# Patient Record
Sex: Female | Born: 1995 | Race: Black or African American | Hispanic: No | Marital: Single | Smoking: Never smoker
Health system: Southern US, Community
[De-identification: ages and names within clinical notes are randomized; demographics above are authoritative.]

## PROBLEM LIST (undated history)

## (undated) HISTORY — PX: CYST EXCISION: SHX5701

---

## 2016-01-26 ENCOUNTER — Encounter (HOSPITAL_COMMUNITY): Payer: Self-pay | Admitting: Emergency Medicine

## 2016-01-26 ENCOUNTER — Emergency Department (HOSPITAL_COMMUNITY)
Admission: EM | Admit: 2016-01-26 | Discharge: 2016-01-26 | Disposition: A | Discharge status: Home / Self Care | Payer: BLUE CROSS/BLUE SHIELD | Attending: Emergency Medicine | Admitting: Emergency Medicine

## 2016-01-26 ENCOUNTER — Emergency Department (HOSPITAL_COMMUNITY): Payer: BLUE CROSS/BLUE SHIELD

## 2016-01-26 DIAGNOSIS — R1110 Vomiting, unspecified: Secondary | ICD-10-CM | POA: Insufficient documentation

## 2016-01-26 DIAGNOSIS — B9789 Other viral agents as the cause of diseases classified elsewhere: Secondary | ICD-10-CM

## 2016-01-26 DIAGNOSIS — R05 Cough: Secondary | ICD-10-CM | POA: Diagnosis present

## 2016-01-26 DIAGNOSIS — J069 Acute upper respiratory infection, unspecified: Secondary | ICD-10-CM | POA: Insufficient documentation

## 2016-01-26 NOTE — ED Provider Notes (Signed)
CSN: [id number]     Arrival date & time 01/26/16  1191 History   First MD Initiated Contact with Patient 01/26/16 0914     Chief Complaint  Patient presents with  . Influenza  . Cough     (Consider location/radiation/quality/duration/timing/severity/associated sxs/prior Treatment) HPI 20 year old female who is on Remicade for the hidradenitis presents with a cough for the past 5 days. 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 She has also had a sick contact who has been diagnosed with the flu. Patient states that 3 days ago she started developing congestion, worse cough with yellow sputum, transient sore throat, and some shortness of breath. Denies headaches or myalgias. Has not had any fevers. Has been taking DayQuil. Concerned about having the flu or something worse.  History reviewed. No pertinent past medical history. Past Surgical History  Procedure Laterality Date  . Cyst excision     No family history on file. Social History  Substance Use Topics  . Smoking status: Never Smoker   . Smokeless tobacco: None  . Alcohol Use: No   OB History    No data available     Review of Systems  Constitutional: Negative for fever.  HENT: Positive for congestion, rhinorrhea and sore throat.   Respiratory: Positive for cough and shortness of breath.   Gastrointestinal: Positive for vomiting (once episode of post-tussive emesis). Negative for nausea and abdominal pain.  Neurological: Negative for headaches.  All other systems reviewed and are negative.     Allergies  Review of patient's allergies indicates no known allergies.  Home Medications   Prior to Admission medications   Not on File   BP 129/71 mmHg  Pulse 76  Temp(Src) 98.6 F (37 C) (Oral)  Resp 14  SpO2 100%  LMP 01/12/2016 Physical Exam  Constitutional: She is oriented to person, place, and time. She appears  well-developed and well-nourished.  HENT:  Head: Normocephalic and atraumatic.  Right Ear: External ear normal.  Left Ear: External ear normal.  Nose: Nose normal.  Mouth/Throat: Oropharynx is clear and moist. No oropharyngeal exudate.  Eyes: Right eye exhibits no discharge. Left eye exhibits no discharge.  Cardiovascular: Normal rate, regular rhythm and normal heart sounds.   Pulmonary/Chest: Effort normal and breath sounds normal.  Abdominal: Soft. There is no tenderness.  Neurological: She is alert and oriented to person, place, and time.  Skin: Skin is warm and dry.  Nursing note and vitals reviewed.   ED Course  Procedures (including critical care time) Labs Review Labs Reviewed - No data to display  Imaging Review Dg Chest 2 View  01/26/2016  CLINICAL DATA:  Flu like symptoms.  Chest congestion and cough. EXAM: CHEST  2 VIEW COMPARISON:  None. FINDINGS: The heart, hila, mediastinum, lungs, and pleura are normal. No acute abnormalities identified. IMPRESSION: No active cardiopulmonary disease. Electronically Signed   By: Gerome Sam III M.D   On: 01/26/2016 10:08   I have personally reviewed and evaluated these images and lab results as part of my medical decision-making.   EKG Interpretation None      MDM   Final diagnoses:  Viral upper respiratory tract infection with cough    Patient symptoms are consistent with a viral upper restaurant infection. While she could have influenza think this is less likely given her mild course without fever or significant myalgias. No indication for specific treatment. She declines  cough medicine or other treatment. Appears quite well, no signs of more severe disease or sepsis. Discharge home with outpatient symptomatically and follow-up with PCP.    Pricilla Loveless, MD 01/26/16 1019

## 2016-01-26 NOTE — ED Notes (Signed)
Pt reports that her roommate mom was recently diagnosed with flu, and that her roommate has been sick. Pt reports cough onset Sunday night. 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

## 2016-07-06 IMAGING — DX DG CHEST 2V
2 series · 2 of 2 positions shown · non-contrast
Comparison: None.

CLINICAL DATA: Flu like symptoms.  Chest congestion and cough.

EXAM:
CHEST  2 VIEW

[chest pa]
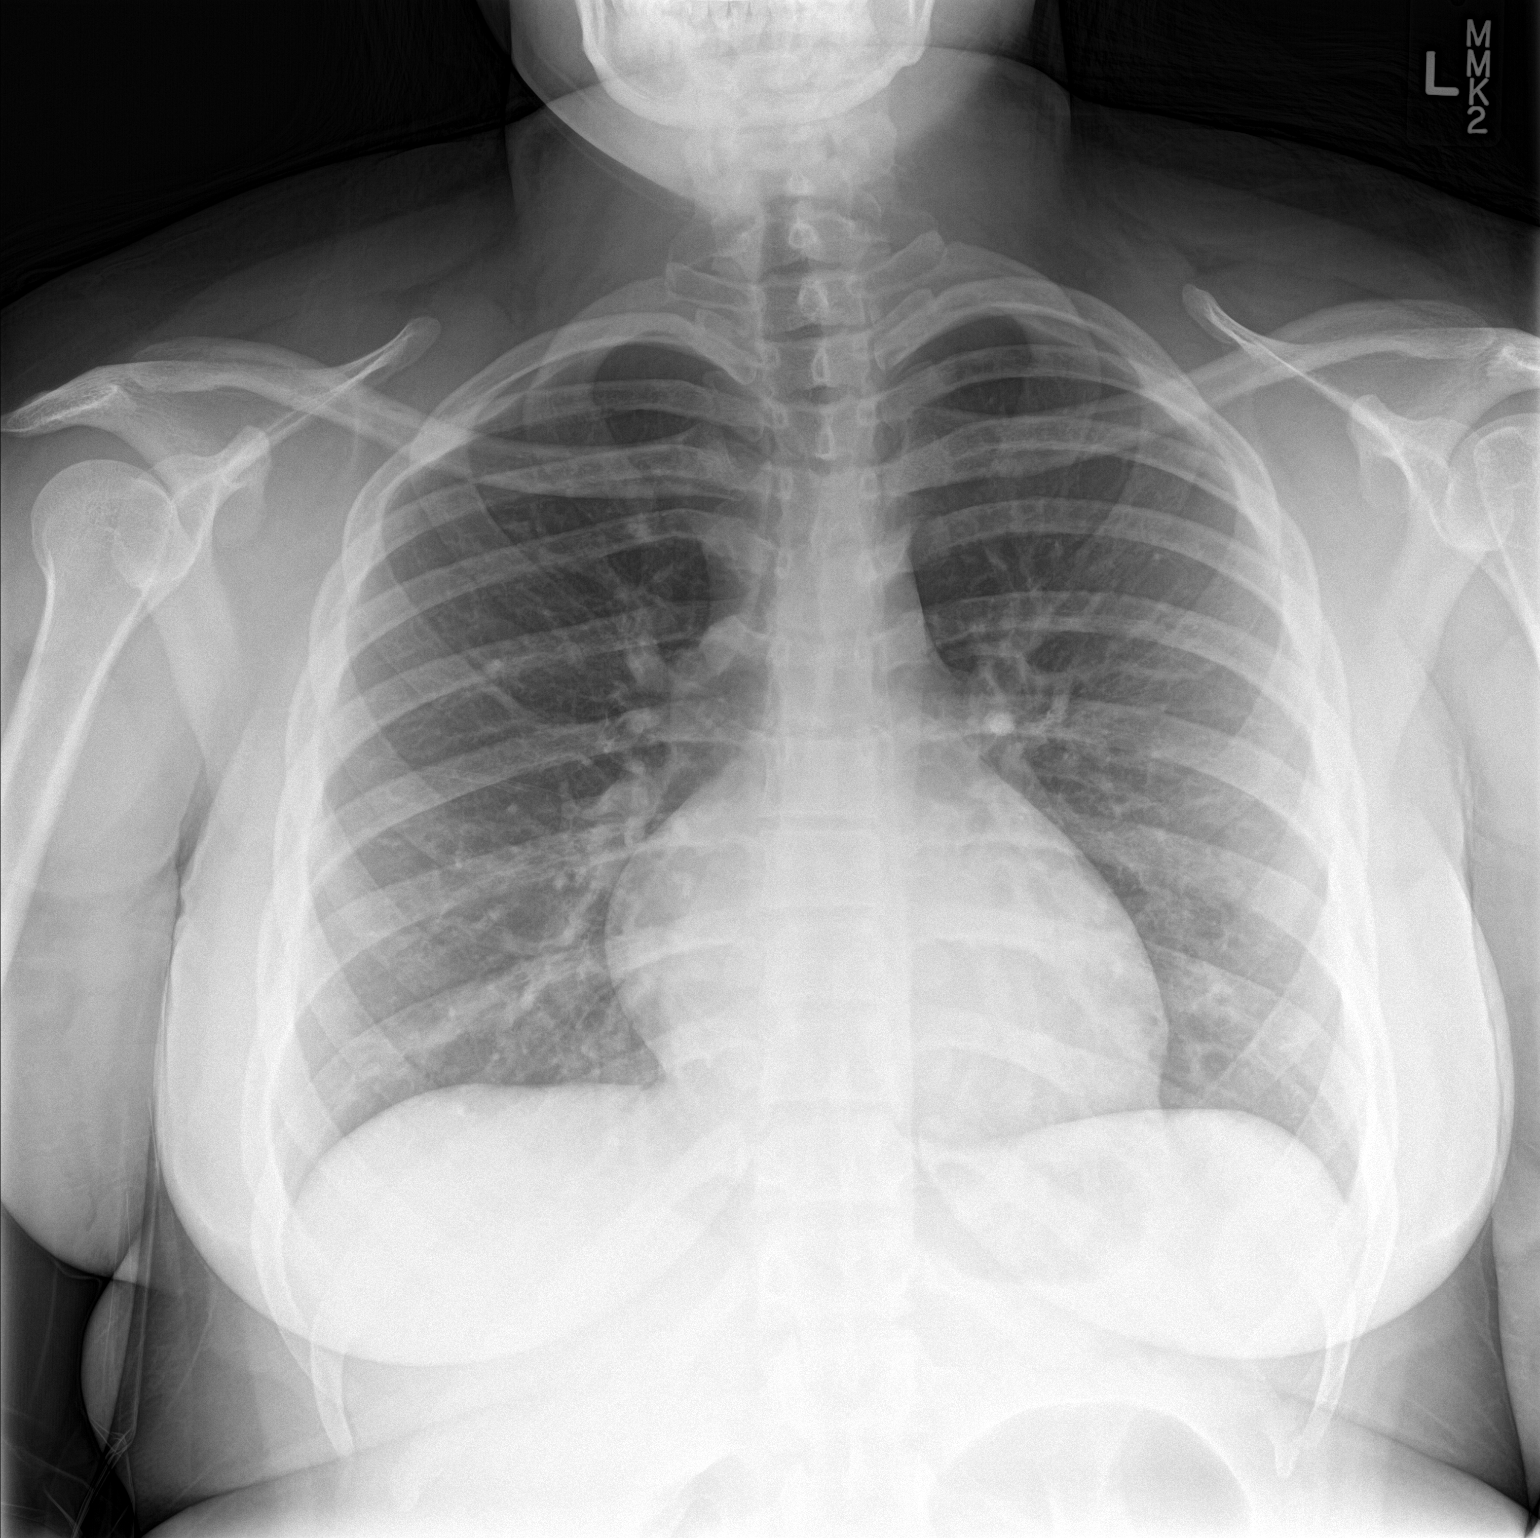

[chest lat]
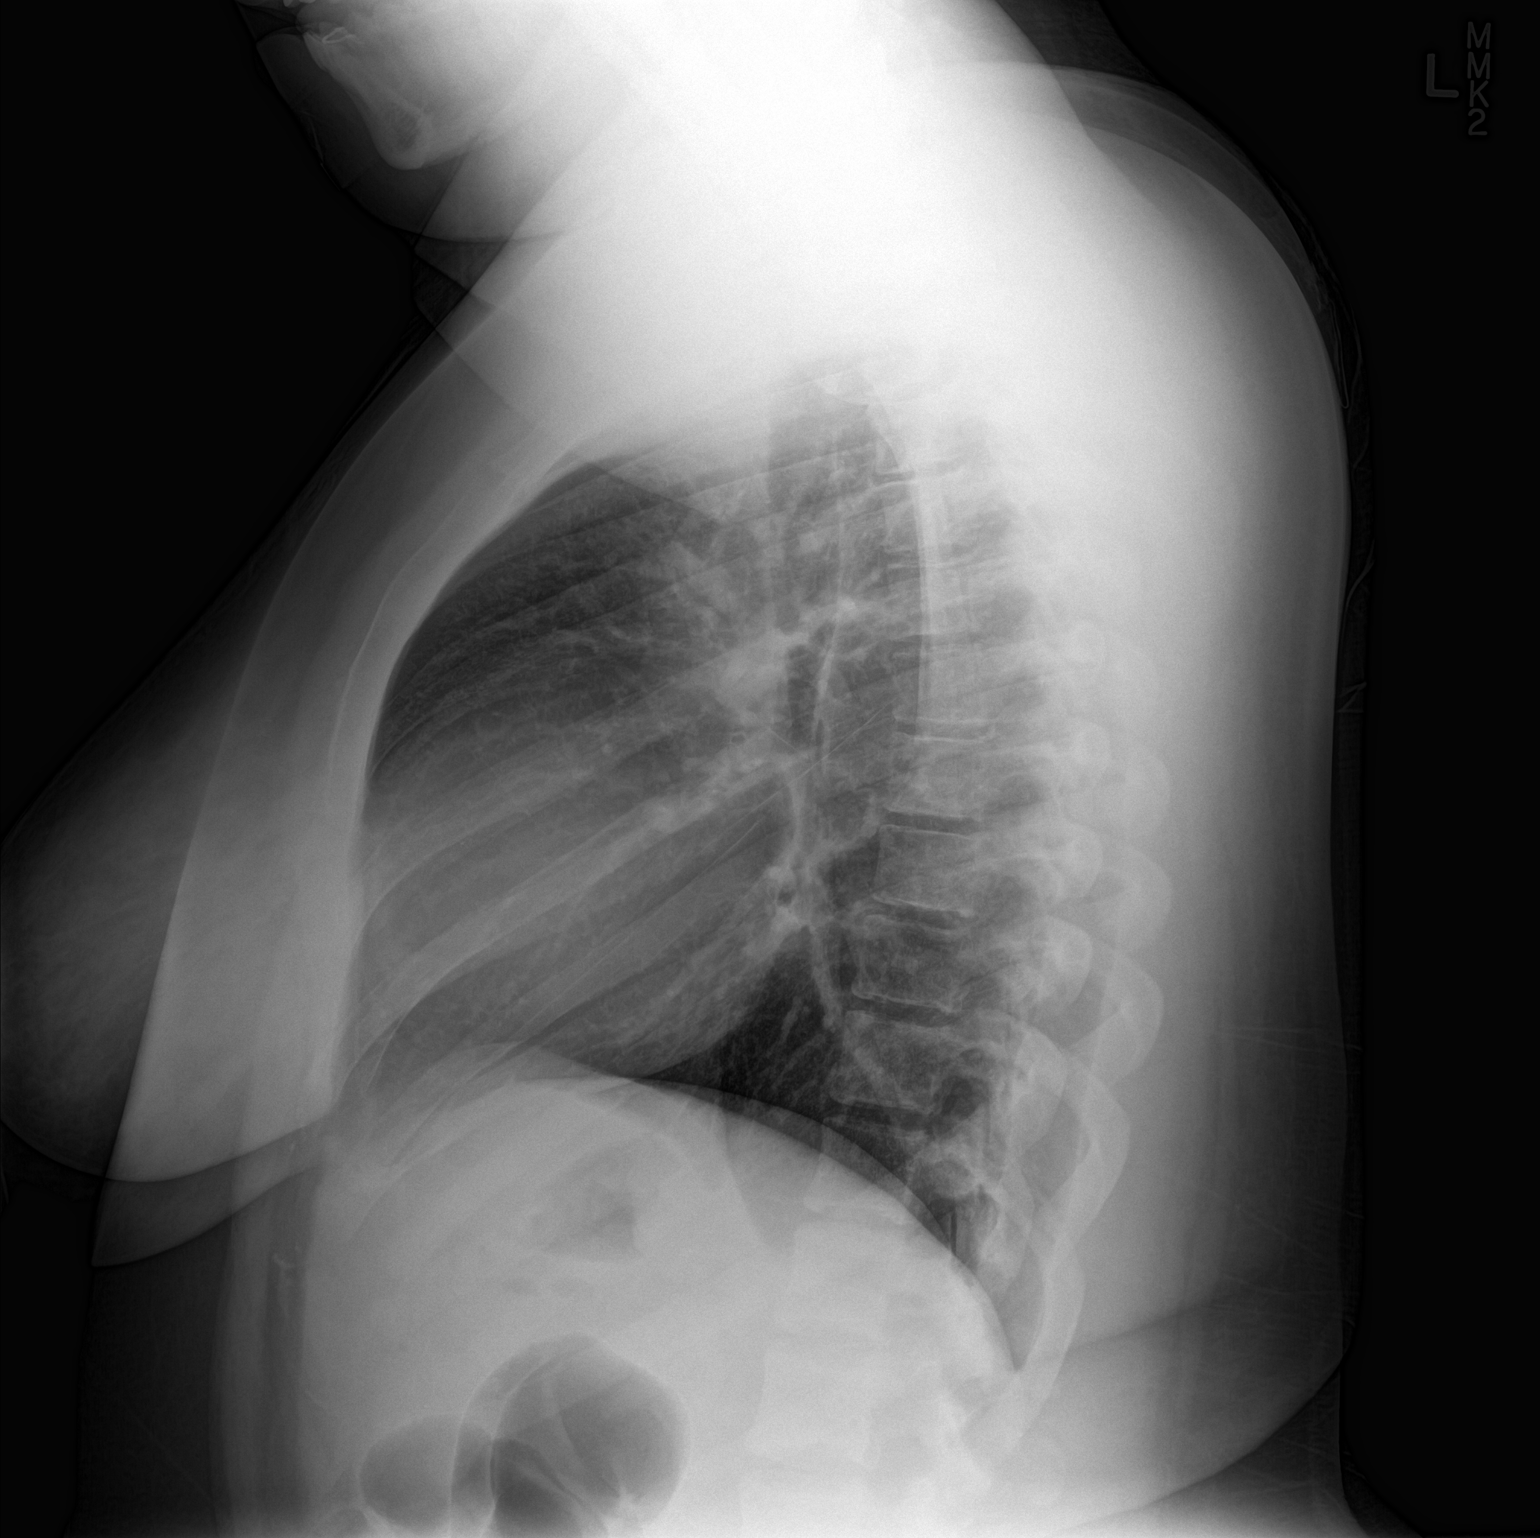

[2 of 2 positions shown; findings below may reference images not displayed]

FINDINGS: The heart, hila, mediastinum, lungs, and pleura are normal. No acute
abnormalities identified.
IMPRESSION: No active cardiopulmonary disease.

## 2023-12-25 ENCOUNTER — Encounter (HOSPITAL_COMMUNITY): Payer: Self-pay | Admitting: Emergency Medicine

## 2023-12-25 ENCOUNTER — Ambulatory Visit (INDEPENDENT_AMBULATORY_CARE_PROVIDER_SITE_OTHER): Payer: Self-pay

## 2023-12-25 ENCOUNTER — Ambulatory Visit (HOSPITAL_COMMUNITY)
Admission: EM | Admit: 2023-12-25 | Discharge: 2023-12-25 | Disposition: A | Discharge status: Home / Self Care | Payer: Self-pay | Attending: Emergency Medicine | Admitting: Emergency Medicine

## 2023-12-25 DIAGNOSIS — S6701XA Crushing injury of right thumb, initial encounter: Secondary | ICD-10-CM

## 2023-12-25 MED ORDER — IBUPROFEN 800 MG PO TABS
800.0000 mg | ORAL_TABLET | Freq: Three times a day (TID) | ORAL | 0 refills | Status: AC | PRN
Start: 2023-12-25 — End: 2024-01-08

## 2023-12-25 NOTE — ED Triage Notes (Signed)
Pt smashed her right thumb in door at work earlier this week. Been icing.

## 2023-12-25 NOTE — ED Provider Notes (Signed)
[facility]    CSN: [id number] Arrival date & time: 12/25/23  1008    HISTORY   Chief Complaint  Patient presents with   Finger Injury   HPI Gloria Hernandez is a pleasant, 28 y.o. female who presents to urgent care today. The history is provided by the patient.   History reviewed. No pertinent past medical history. There are no active problems to display for this patient.  Past Surgical History:  Procedure Laterality Date   CYST EXCISION     OB History   No obstetric history on file.    Home Medications    Prior to Admission medications   Medication Sig Start Date End Date Taking? Authorizing Provider  Homeopathic Products [facility] COLD REMEDY PO) Take 1 Dose by mouth every 8 (eight) hours as needed (cold symptoms).     [provider]  inFLIXimab (REMICADE) 100 MG injection Inject 100 mg into the vein every 8 (eight) weeks.    [provider]  Multiple Vitamins-Minerals (AIRBORNE PO) Take 1 Dose by mouth every 8 (eight) hours as needed (cold symptoms).     [provider]  Pseudoephedrine-APAP-DM (DAYQUIL PO) Take 1 Dose by mouth every 8 (eight) hours as needed (cold symptoms).     [provider]    Family History No family history on file. Social History Social History   Tobacco Use   Smoking status: Never  Substance Use Topics   Alcohol use: No   Drug use: No   Allergies   Patient has no known allergies.  Review of Systems Review of Systems Pertinent findings revealed after performing a 14 point review of systems has been noted in the history of present illness.  Physical Exam Vital Signs BP 132/80 (BP Location: Right Arm)   Pulse 82   Temp 99.4 F (37.4 C) (Oral)   Resp 17   LMP 12/11/2023 (Approximate)   SpO2 98%   No data found.  Physical Exam Vitals and nursing note reviewed.  Constitutional:      General: She is not in acute distress.    Appearance: Normal appearance.  HENT:     Head:  Normocephalic and atraumatic.  Eyes:     Pupils: Pupils are equal, round, and reactive to light.  Cardiovascular:     Rate and Rhythm: Normal rate and regular rhythm.  Pulmonary:     Effort: Pulmonary effort is normal.     Breath sounds: Normal breath sounds.  Musculoskeletal:        General: Normal range of motion.       Hands:     Cervical back: Normal range of motion and neck supple.  Skin:    General: Skin is warm and dry.  Neurological:     General: No focal deficit present.     Mental Status: She is alert and oriented to person, place, and time. Mental status is at baseline.  Psychiatric:        Mood and Affect: Mood normal.        Behavior: Behavior normal.        Thought Content: Thought content normal.        Judgment: Judgment normal.     Visual Acuity Right Eye Distance:   Left Eye Distance:   Bilateral Distance:    Right Eye Near:   Left Eye Near:    Bilateral Near:     UC Couse / Diagnostics / Procedures:     Radiology No results found.  Procedures  Procedures (including critical care time) EKG  Pending results:  Labs Reviewed - No data to display  Medications Ordered in UC: Medications - No data to display  UC Diagnoses / Final Clinical Impressions(s)   I have reviewed the triage vital signs and the nursing notes.  Pertinent labs & imaging results that were available during my care of the patient were reviewed by me and considered in my medical decision making (see chart for details).    Final diagnoses:  Crushing injury of right thumb, initial encounter   Per dependent read of patient's x-ray, patient may have crush the distal phalanx of her right thumb.  We will contact patient with official radiology report once we receive it.  In the meantime, patient was placed in a splint for protection, provided with prescription for ibuprofen 800 mg that she can take every 6-8 hours as needed for pain and patient advised to keep hand elevated and ice  is much as she likes.  Conservative care recommended.  Return precautions advised.  Please see discharge instructions below for details of plan of care as provided to patient. ED Prescriptions     Medication Sig Dispense Auth. Provider   ibuprofen (ADVIL) 800 MG tablet Take 1 tablet (800 mg total) by mouth every 8 (eight) hours as needed for up to 14 days. 42 tablet Theadora Rama Scales, PA-C      PDMP not reviewed this encounter.  Pending results:  Labs Reviewed - No data to display    Discharge Instructions      We will contact you with the official report of your x-ray of your right thumb.  Per my personal interpretation, it is possible that bone in the tip of your right thumb has been crushed.  No treatment is needed other than to protect the thumb until it is feeling better.  We placed you in a splint today you are welcome to wear as tolerated to keep you from bumping her thumb and anything else.    To reduce pain, try to keep your hand elevated.  I have also provided you with a prescription for ibuprofen 800 mg that you can take every 6-8 hours as needed.  You can also continue icing her thumb is much as you like.  If your thumb is feeling no better in the next 10 to 14 days, please follow-up with an orthopedic specialist for further evaluation.  Thank you for visiting [facility] today.        Disposition Upon Discharge:  Condition: stable for discharge home  Patient presented with an acute illness with associated systemic symptoms and significant discomfort requiring urgent management. In my opinion, this is a condition that a prudent lay person (someone who possesses an average knowledge of health and medicine) may potentially expect to result in complications if not addressed urgently such as respiratory distress, impairment of bodily function or dysfunction of bodily organs.   Routine symptom specific, illness specific and/or disease specific  instructions were discussed with the patient and/or caregiver at length.   As such, the patient has been evaluated and assessed, work-up was performed and treatment was provided in alignment with urgent care protocols and evidence based medicine.  Patient/parent/caregiver has been advised that the patient may require follow up for further testing and treatment if the symptoms continue in spite of treatment, as clinically indicated and appropriate.  Patient/parent/caregiver has been advised to return to the [facility] or PCP if no better; to PCP or the  Emergency Department if new signs and symptoms develop, or if the current signs or symptoms continue to change or worsen for further workup, evaluation and treatment as clinically indicated and appropriate  The patient will follow up with their current PCP if and as advised. If the patient does not currently have a PCP we will assist them in obtaining one.   The patient may need specialty follow up if the symptoms continue, in spite of conservative treatment and management, for further workup, evaluation, consultation and treatment as clinically indicated and appropriate.  Patient/parent/caregiver verbalized understanding and agreement of plan as discussed.  All questions were addressed during visit.  Please see discharge instructions below for further details of plan.  This office note has been dictated using Teaching laboratory technician.  Unfortunately, this method of dictation can sometimes lead to typographical or grammatical errors.  I apologize for your inconvenience in advance if this occurs.  Please do not hesitate to reach out to me if clarification is needed.      Theadora Rama Scales, PA-C 12/25/23 1140

## 2023-12-25 NOTE — Discharge Instructions (Signed)
We will contact you with the official report of your x-ray of your right thumb.  Per my personal interpretation, it is possible that bone in the tip of your right thumb has been crushed.  No treatment is needed other than to protect the thumb until it is feeling better.  We placed you in a splint today you are welcome to wear as tolerated to keep you from bumping her thumb and anything else.    To reduce pain, try to keep your hand elevated.  I have also provided you with a prescription for ibuprofen 800 mg that you can take every 6-8 hours as needed.  You can also continue icing her thumb is much as you like.  If your thumb is feeling no better in the next 10 to 14 days, please follow-up with an orthopedic specialist for further evaluation.  Thank you for visiting [facility] today.

## 2024-02-04 DIAGNOSIS — M2540 Effusion, unspecified joint: Secondary | ICD-10-CM | POA: Diagnosis not present

## 2024-02-04 DIAGNOSIS — Z87891 Personal history of nicotine dependence: Secondary | ICD-10-CM | POA: Diagnosis not present

## 2024-02-04 DIAGNOSIS — L03116 Cellulitis of left lower limb: Secondary | ICD-10-CM | POA: Diagnosis not present

## 2024-02-08 DIAGNOSIS — Z87891 Personal history of nicotine dependence: Secondary | ICD-10-CM | POA: Diagnosis not present

## 2024-02-08 DIAGNOSIS — L0390 Cellulitis, unspecified: Secondary | ICD-10-CM | POA: Diagnosis not present

## 2024-02-08 DIAGNOSIS — L03116 Cellulitis of left lower limb: Secondary | ICD-10-CM | POA: Diagnosis not present

## 2024-03-14 DIAGNOSIS — Z79899 Other long term (current) drug therapy: Secondary | ICD-10-CM | POA: Diagnosis not present

## 2024-03-14 DIAGNOSIS — R030 Elevated blood-pressure reading, without diagnosis of hypertension: Secondary | ICD-10-CM | POA: Diagnosis not present

## 2024-03-14 DIAGNOSIS — L732 Hidradenitis suppurativa: Secondary | ICD-10-CM | POA: Diagnosis not present

## 2024-03-14 DIAGNOSIS — Z87891 Personal history of nicotine dependence: Secondary | ICD-10-CM | POA: Diagnosis not present

## 2024-03-14 DIAGNOSIS — L03116 Cellulitis of left lower limb: Secondary | ICD-10-CM | POA: Diagnosis not present

## 2024-03-14 DIAGNOSIS — Z872 Personal history of diseases of the skin and subcutaneous tissue: Secondary | ICD-10-CM | POA: Diagnosis not present

## 2024-03-14 DIAGNOSIS — Z6836 Body mass index (BMI) 36.0-36.9, adult: Secondary | ICD-10-CM | POA: Diagnosis not present

## 2024-03-14 DIAGNOSIS — Z91048 Other nonmedicinal substance allergy status: Secondary | ICD-10-CM | POA: Diagnosis not present

## 2024-03-14 DIAGNOSIS — D509 Iron deficiency anemia, unspecified: Secondary | ICD-10-CM | POA: Diagnosis not present

## 2024-03-14 DIAGNOSIS — F329 Major depressive disorder, single episode, unspecified: Secondary | ICD-10-CM | POA: Diagnosis not present

## 2024-03-16 DIAGNOSIS — D509 Iron deficiency anemia, unspecified: Secondary | ICD-10-CM | POA: Diagnosis not present

## 2024-03-16 DIAGNOSIS — B999 Unspecified infectious disease: Secondary | ICD-10-CM | POA: Diagnosis not present

## 2024-03-16 DIAGNOSIS — L03116 Cellulitis of left lower limb: Secondary | ICD-10-CM | POA: Diagnosis not present

## 2024-03-16 DIAGNOSIS — D638 Anemia in other chronic diseases classified elsewhere: Secondary | ICD-10-CM | POA: Diagnosis not present

## 2024-03-16 DIAGNOSIS — L732 Hidradenitis suppurativa: Secondary | ICD-10-CM | POA: Diagnosis not present

## 2024-03-16 DIAGNOSIS — L02416 Cutaneous abscess of left lower limb: Secondary | ICD-10-CM | POA: Diagnosis not present

## 2024-03-17 DIAGNOSIS — L732 Hidradenitis suppurativa: Secondary | ICD-10-CM | POA: Diagnosis not present

## 2024-03-17 DIAGNOSIS — L02416 Cutaneous abscess of left lower limb: Secondary | ICD-10-CM | POA: Diagnosis not present

## 2024-03-17 DIAGNOSIS — D509 Iron deficiency anemia, unspecified: Secondary | ICD-10-CM | POA: Diagnosis not present

## 2024-03-17 DIAGNOSIS — L03116 Cellulitis of left lower limb: Secondary | ICD-10-CM | POA: Diagnosis not present

## 2024-03-17 DIAGNOSIS — B999 Unspecified infectious disease: Secondary | ICD-10-CM | POA: Diagnosis not present

## 2024-03-17 DIAGNOSIS — D638 Anemia in other chronic diseases classified elsewhere: Secondary | ICD-10-CM | POA: Diagnosis not present

## 2024-05-24 DIAGNOSIS — Z79899 Other long term (current) drug therapy: Secondary | ICD-10-CM | POA: Diagnosis not present

## 2024-05-24 DIAGNOSIS — L409 Psoriasis, unspecified: Secondary | ICD-10-CM | POA: Diagnosis not present

## 2024-05-24 DIAGNOSIS — L732 Hidradenitis suppurativa: Secondary | ICD-10-CM | POA: Diagnosis not present

## 2024-09-27 DIAGNOSIS — Z113 Encounter for screening for infections with a predominantly sexual mode of transmission: Secondary | ICD-10-CM | POA: Diagnosis not present

## 2024-09-27 DIAGNOSIS — D509 Iron deficiency anemia, unspecified: Secondary | ICD-10-CM | POA: Diagnosis not present

## 2024-09-27 DIAGNOSIS — L732 Hidradenitis suppurativa: Secondary | ICD-10-CM | POA: Diagnosis not present

## 2024-09-27 DIAGNOSIS — Z309 Encounter for contraceptive management, unspecified: Secondary | ICD-10-CM | POA: Diagnosis not present
